# Patient Record
Sex: Female | Born: 1965 | Race: Black or African American | Hispanic: No | Marital: Single | Smoking: Never smoker
Health system: Southern US, Community
[De-identification: ages and names within clinical notes are randomized; demographics above are authoritative.]

## PROBLEM LIST (undated history)

## (undated) DIAGNOSIS — N289 Disorder of kidney and ureter, unspecified: Secondary | ICD-10-CM

## (undated) DIAGNOSIS — I82409 Acute embolism and thrombosis of unspecified deep veins of unspecified lower extremity: Secondary | ICD-10-CM

## (undated) DIAGNOSIS — I2699 Other pulmonary embolism without acute cor pulmonale: Secondary | ICD-10-CM

## (undated) DIAGNOSIS — K56609 Unspecified intestinal obstruction, unspecified as to partial versus complete obstruction: Secondary | ICD-10-CM

## (undated) DIAGNOSIS — I10 Essential (primary) hypertension: Secondary | ICD-10-CM

## (undated) DIAGNOSIS — Z992 Dependence on renal dialysis: Secondary | ICD-10-CM

## (undated) DIAGNOSIS — E119 Type 2 diabetes mellitus without complications: Secondary | ICD-10-CM

## (undated) HISTORY — PX: COLON RESECTION: SHX5231

## (undated) HISTORY — PX: GASTROPLASTY DUODENAL SWITCH: SHX1699

---

## 2017-06-26 ENCOUNTER — Other Ambulatory Visit: Payer: Self-pay

## 2017-06-26 ENCOUNTER — Encounter (HOSPITAL_BASED_OUTPATIENT_CLINIC_OR_DEPARTMENT_OTHER): Payer: Self-pay

## 2017-06-26 ENCOUNTER — Emergency Department (HOSPITAL_BASED_OUTPATIENT_CLINIC_OR_DEPARTMENT_OTHER): Payer: Medicare Other

## 2017-06-26 ENCOUNTER — Emergency Department (HOSPITAL_BASED_OUTPATIENT_CLINIC_OR_DEPARTMENT_OTHER)
Admission: EM | Admit: 2017-06-26 | Discharge: 2017-06-27 | Disposition: A | Discharge status: Home / Self Care | Payer: Medicare Other | Attending: Emergency Medicine | Admitting: Emergency Medicine

## 2017-06-26 DIAGNOSIS — I10 Essential (primary) hypertension: Secondary | ICD-10-CM | POA: Insufficient documentation

## 2017-06-26 DIAGNOSIS — R110 Nausea: Secondary | ICD-10-CM | POA: Insufficient documentation

## 2017-06-26 DIAGNOSIS — R1032 Left lower quadrant pain: Secondary | ICD-10-CM | POA: Insufficient documentation

## 2017-06-26 DIAGNOSIS — K5900 Constipation, unspecified: Secondary | ICD-10-CM | POA: Diagnosis not present

## 2017-06-26 DIAGNOSIS — D735 Infarction of spleen: Secondary | ICD-10-CM | POA: Insufficient documentation

## 2017-06-26 DIAGNOSIS — R109 Unspecified abdominal pain: Secondary | ICD-10-CM | POA: Diagnosis present

## 2017-06-26 DIAGNOSIS — E119 Type 2 diabetes mellitus without complications: Secondary | ICD-10-CM | POA: Insufficient documentation

## 2017-06-26 HISTORY — DX: Unspecified intestinal obstruction, unspecified as to partial versus complete obstruction: K56.609

## 2017-06-26 HISTORY — DX: Other pulmonary embolism without acute cor pulmonale: I26.99

## 2017-06-26 HISTORY — DX: Essential (primary) hypertension: I10

## 2017-06-26 HISTORY — DX: Type 2 diabetes mellitus without complications: E11.9

## 2017-06-26 HISTORY — DX: Acute embolism and thrombosis of unspecified deep veins of unspecified lower extremity: I82.409

## 2017-06-26 HISTORY — DX: Dependence on renal dialysis: Z99.2

## 2017-06-26 LAB — COMPREHENSIVE METABOLIC PANEL
ALK PHOS: 109 U/L (ref 38–126)
ALT: 18 U/L (ref 0–44)
AST: 16 U/L (ref 15–41)
Albumin: 3.1 g/dL — ABNORMAL LOW (ref 3.5–5.0)
Anion gap: 11 (ref 5–15)
BILIRUBIN TOTAL: 0.5 mg/dL (ref 0.3–1.2)
BUN: 44 mg/dL — ABNORMAL HIGH (ref 6–20)
CALCIUM: 9.1 mg/dL (ref 8.9–10.3)
CO2: 29 mmol/L (ref 22–32)
CREATININE: 5.86 mg/dL — AB (ref 0.44–1.00)
Chloride: 95 mmol/L — ABNORMAL LOW (ref 98–111)
GFR calc Af Amer: 9 mL/min — ABNORMAL LOW (ref >60)
GFR, EST NON AFRICAN AMERICAN: 8 mL/min — AB (ref >60)
GLUCOSE: 160 mg/dL — AB (ref 70–99)
POTASSIUM: 3.7 mmol/L (ref 3.5–5.1)
Sodium: 135 mmol/L (ref 135–145)
TOTAL PROTEIN: 7 g/dL (ref 6.5–8.1)

## 2017-06-26 LAB — CBC WITH DIFFERENTIAL/PLATELET
BASOS ABS: 0 10*3/uL (ref 0.0–0.1)
Basophils Relative: 0 %
EOS PCT: 3 %
Eosinophils Absolute: 0.3 10*3/uL (ref 0.0–0.7)
HEMATOCRIT: 30.2 % — AB (ref 36.0–46.0)
HEMOGLOBIN: 9.6 g/dL — AB (ref 12.0–15.0)
LYMPHS ABS: 1.1 10*3/uL (ref 0.7–4.0)
LYMPHS PCT: 10 %
MCH: 29.8 pg (ref 26.0–34.0)
MCHC: 31.8 g/dL (ref 30.0–36.0)
MCV: 93.8 fL (ref 78.0–100.0)
Monocytes Absolute: 0.6 10*3/uL (ref 0.1–1.0)
Monocytes Relative: 6 %
NEUTROS ABS: 8.7 10*3/uL — AB (ref 1.7–7.7)
NEUTROS PCT: 81 %
PLATELETS: 127 10*3/uL — AB (ref 150–400)
RBC: 3.22 MIL/uL — AB (ref 3.87–5.11)
RDW: 15.2 % (ref 11.5–15.5)
WBC: 10.7 10*3/uL — ABNORMAL HIGH (ref 4.0–10.5)

## 2017-06-26 LAB — LIPASE, BLOOD: Lipase: 20 U/L (ref 11–51)

## 2017-06-26 MED ORDER — ONDANSETRON HCL 4 MG/2ML IJ SOLN
4.0000 mg | Freq: Once | INTRAMUSCULAR | Status: AC
  Administered 2017-06-27: 4 mg via INTRAVENOUS
  Filled 2017-06-26: qty 2

## 2017-06-26 MED ORDER — ONDANSETRON HCL 4 MG/2ML IJ SOLN
4.0000 mg | Freq: Once | INTRAMUSCULAR | Status: AC
  Administered 2017-06-26: 4 mg via INTRAVENOUS
  Filled 2017-06-26: qty 2

## 2017-06-26 MED ORDER — HYDROMORPHONE HCL 1 MG/ML IJ SOLN
0.5000 mg | Freq: Once | INTRAMUSCULAR | Status: AC
  Administered 2017-06-27: 0.5 mg via INTRAVENOUS
  Filled 2017-06-26: qty 1

## 2017-06-26 MED ORDER — IOPAMIDOL (ISOVUE-300) INJECTION 61%
100.0000 mL | Freq: Once | INTRAVENOUS | Status: AC | PRN
  Administered 2017-06-26: 100 mL via INTRAVENOUS

## 2017-06-26 MED ORDER — HYDROMORPHONE HCL 1 MG/ML IJ SOLN
0.5000 mg | Freq: Once | INTRAMUSCULAR | Status: AC
Start: 2017-06-26 — End: 2017-06-26
  Administered 2017-06-26: 0.5 mg via INTRAVENOUS
  Filled 2017-06-26: qty 1

## 2017-06-26 NOTE — ED Notes (Signed)
Patient transported to CT 

## 2017-06-26 NOTE — ED Provider Notes (Signed)
[facility] EMERGENCY DEPARTMENT Provider Note   CSN: [id number] Arrival date & time: 06/26/17  2100     History   Chief Complaint Chief Complaint  Patient presents with  . Abdominal Pain    HPI Katie Sharp is a 52 y.o. female.  52 yo F with a chief complaint of abdominal pain.  This is worse to the left flank.  Seems to come and go.  Now is more generalized.  Feels that severe.  Has a history of multiple small bowel obstructions in the past.  Does not think this quite feels the same.  Nausea but no vomiting.  Some constipation.  The history is provided by the patient.  Abdominal Pain   This is a recurrent problem. The current episode started yesterday. The problem occurs constantly. The problem has been rapidly worsening. The pain is located in the LLQ. The quality of the pain is sharp and shooting. The pain is at a severity of 9/10. The pain is severe. Associated symptoms include nausea and constipation. Pertinent negatives include fever, vomiting, dysuria, headaches, arthralgias and myalgias. Nothing aggravates the symptoms. Nothing relieves the symptoms.    Past Medical History:  Diagnosis Date  . Bowel obstruction (HCC)   . Diabetes mellitus without complication (HCC)   . Dialysis patient (HCC)   . DVT (deep venous thrombosis) (HCC)   . Hypertension   . Pulmonary emboli (HCC)     There are no active problems to display for this patient.   Past Surgical History:  Procedure Laterality Date  . COLON RESECTION    . GASTROPLASTY DUODENAL SWITCH       OB History   None      Home Medications    Prior to Admission medications   Medication Sig Start Date End Date Taking? Authorizing Provider  ondansetron (ZOFRAN ODT) 4 MG disintegrating tablet 4mg  ODT q4 hours prn nausea/vomit 06/26/17   Melene PlanFloyd, Linford Quintela, DO  oxyCODONE (ROXICODONE) 5 MG immediate release tablet Take 1 tablet (5 mg total) by mouth every 4 (four) hours as needed for severe pain. 06/26/17    Melene PlanFloyd, Eddy Termine, DO    Family History No family history on file.  Social History Social History   Tobacco Use  . Smoking status: Never Smoker  . Smokeless tobacco: Never Used  Substance Use Topics  . Alcohol use: Yes    Comment: occ  . Drug use: Never     Allergies   Lisinopril; Oxycodone; and Statins   Review of Systems Review of Systems  Constitutional: Negative for chills and fever.  HENT: Negative for congestion and rhinorrhea.   Eyes: Negative for redness and visual disturbance.  Respiratory: Negative for shortness of breath and wheezing.   Cardiovascular: Negative for chest pain and palpitations.  Gastrointestinal: Positive for abdominal pain, constipation and nausea. Negative for vomiting.  Genitourinary: Negative for dysuria and urgency.  Musculoskeletal: Negative for arthralgias and myalgias.  Skin: Negative for pallor and wound.  Neurological: Negative for dizziness and headaches.     Physical Exam Updated Vital Signs BP 112/66 (BP Location: Right Arm)   Pulse 92   Temp 98.4 F (36.9 C) (Oral)   Resp 20   Ht 5\' 5"  (1.651 m)   Wt 106.6 kg (235 lb)   SpO2 94%   BMI 39.11 kg/m   Physical Exam  Constitutional: She is oriented to person, place, and time. She appears well-developed and well-nourished. No distress.  HENT:  Head: Normocephalic and atraumatic.  Eyes: Pupils are  equal, round, and reactive to light. EOM are normal.  Neck: Normal range of motion. Neck supple.  Cardiovascular: Normal rate and regular rhythm. Exam reveals no gallop and no friction rub.  No murmur heard. Pulmonary/Chest: Effort normal. She has no wheezes. She has no rales.  Abdominal: Soft. She exhibits distension ( Tympanitic to percussion). There is generalized tenderness.  Musculoskeletal: She exhibits no edema or tenderness.  Left AV fistula with palpable thrill  Neurological: She is alert and oriented to person, place, and time.  Skin: Skin is warm and dry. She is not  diaphoretic.  Psychiatric: She has a normal mood and affect. Her behavior is normal.  Nursing note and vitals reviewed.    ED Treatments / Results  Labs (all labs ordered are listed, but only abnormal results are displayed) Labs Reviewed  CBC WITH DIFFERENTIAL/PLATELET - Abnormal; Notable for the following components:      Result Value   WBC 10.7 (*)    RBC 3.22 (*)    Hemoglobin 9.6 (*)    HCT 30.2 (*)    Platelets 127 (*)    Neutro Abs 8.7 (*)    All other components within normal limits  COMPREHENSIVE METABOLIC PANEL - Abnormal; Notable for the following components:   Chloride 95 (*)    Glucose, Bld 160 (*)    BUN 44 (*)    Creatinine, Ser 5.86 (*)    Albumin 3.1 (*)    GFR calc non Af Amer 8 (*)    GFR calc Af Amer 9 (*)    All other components within normal limits  LIPASE, BLOOD    EKG None  Radiology Ct Abdomen Pelvis W Contrast  Result Date: 06/26/2017 CLINICAL DATA:  52 y/o F; 2 days of left-sided abdominal pain. ESRD and constipation. EXAM: CT ABDOMEN AND PELVIS WITH CONTRAST TECHNIQUE: Multidetector CT imaging of the abdomen and pelvis was performed using the standard protocol following bolus administration of intravenous contrast. CONTRAST:  ISOVUE-300 IOPAMIDOL (ISOVUE-300) INJECTION 61% COMPARISON:  None. FINDINGS: Lower chest: Aortic valvular and mitral annular calcification. Hepatobiliary: No focal liver abnormality is seen. Status post cholecystectomy. No biliary dilatation. Pancreas: Unremarkable. No pancreatic ductal dilatation or surrounding inflammatory changes. Spleen: Wedge-shaped low-attenuation focus within the inferior spleen (series 6, image 98). Mild surrounding edema. Adrenals/Urinary Tract: Adrenal glands are unremarkable. Kidneys are normal, without renal calculi, focal lesion, or hydronephrosis. Bladder is unremarkable. Stomach/Bowel: Gastroplasty postsurgical changes with patent anastomosis. Appendix appears normal. No evidence of bowel wall  thickening, distention, or inflammatory changes. Vascular/Lymphatic: Aortic atherosclerosis. No enlarged abdominal or pelvic lymph nodes. Reproductive: Uterus and bilateral adnexa are unremarkable. Other: No abdominal wall hernia or abnormality. No abdominopelvic ascites. Musculoskeletal: No acute or significant osseous findings. 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 Electronically Signed   By: Mitzi Hansen M.D.   On: 06/26/2017 23:21    Procedures Procedures (including critical care time)  Medications Ordered in ED Medications  HYDROmorphone (DILAUDID) injection 0.5 mg (0.5 mg Intravenous Given 06/26/17 2159)  ondansetron (ZOFRAN) injection 4 mg (4 mg Intravenous Given 06/26/17 2159)  iopamidol (ISOVUE-300) 61 % injection 100 mL (100 mLs Intravenous Contrast Given 06/26/17 2250)  HYDROmorphone (DILAUDID) injection 0.5 mg (0.5 mg Intravenous Given 06/27/17 0003)  ondansetron (ZOFRAN) injection 4 mg (4 mg Intravenous Given 06/27/17 0002)     Initial Impression / Assessment and Plan / ED Course  I have reviewed the triage vital signs and the nursing notes.  Pertinent labs & imaging results that were available during my care of the patient were reviewed by me and considered in my medical decision making (see chart for details).     52 yo F with a chief complaint of left sided abdominal pain.  On my exam is diffusely tender with some distention and tympanitic to percussion.  With history of small bowel obstruction in the past will obtain a CT scan with contrast.  CT with a possible splenic infarct.  Patient feeling better on reassessment.  With her history of thromboembolic events I suspect this is the most likely cause.  She is having no infectious symptoms concerning for septic emboli.  Will give pain and nausea meds for home, she will call her doctor at [facility] and discuss the findings.  She is on eliquis for  prior thromboembolic events.  She will return for worsening pain, fever inability to eat or drink.   12:06 AM:  I have discussed the diagnosis/risks/treatment options with the patient and family and believe the pt to be eligible for discharge home to follow-up with PCP, transplant, renal. We also discussed returning to the ED immediately if new or worsening sx occur. We discussed the sx which are most concerning (e.g., sudden worsening pain, fever, inability to tolerate by mouth) that necessitate immediate return. Medications administered to the patient during their visit and any new prescriptions provided to the patient are listed below.  Medications given during this visit Medications  HYDROmorphone (DILAUDID) injection 0.5 mg (0.5 mg Intravenous Given 06/26/17 2159)  ondansetron (ZOFRAN) injection 4 mg (4 mg Intravenous Given 06/26/17 2159)  iopamidol (ISOVUE-300) 61 % injection 100 mL (100 mLs Intravenous Contrast Given 06/26/17 2250)  HYDROmorphone (DILAUDID) injection 0.5 mg (0.5 mg Intravenous Given 06/27/17 0003)  ondansetron (ZOFRAN) injection 4 mg (4 mg Intravenous Given 06/27/17 0002)     The patient appears reasonably screen and/or stabilized for discharge and I doubt any other medical condition or other [facility] requiring further screening, evaluation, or treatment in the ED at this time prior to discharge.    Final Clinical Impressions(s) / ED Diagnoses   Final diagnoses:  Splenic infarct    ED Discharge Orders        Ordered    ondansetron (ZOFRAN ODT) 4 MG disintegrating tablet     06/27/17 0000    oxyCODONE (ROXICODONE) 5 MG immediate release tablet  Every 4 hours PRN     06/27/17 0000       Melene Plan, DO 06/27/17 0006

## 2017-06-26 NOTE — ED Triage Notes (Signed)
C/o left side abd pain, constipation-NAD-steady gait

## 2017-06-26 NOTE — ED Notes (Signed)
ED Provider at bedside. 

## 2017-06-27 DIAGNOSIS — D735 Infarction of spleen: Secondary | ICD-10-CM | POA: Diagnosis not present

## 2017-06-27 MED ORDER — HYDROMORPHONE HCL 2 MG PO TABS: 2.0000 mg | ORAL_TABLET | ORAL | 0 refills | Status: AC | PRN

## 2017-06-27 MED ORDER — OXYCODONE HCL 5 MG PO TABS: 5.0000 mg | ORAL_TABLET | ORAL | 0 refills | Status: DC | PRN

## 2017-06-27 MED ORDER — PROMETHAZINE HCL 25 MG PO TABS: 25.0000 mg | ORAL_TABLET | Freq: Four times a day (QID) | ORAL | 0 refills | Status: AC | PRN

## 2017-06-27 MED ORDER — ONDANSETRON 4 MG PO TBDP: ORAL_TABLET | ORAL | 0 refills | Status: DC

## 2017-06-27 NOTE — ED Notes (Signed)
Topaz not working at this time. Pt given d/c papers and attempted to sign but unable to capture.

## 2017-06-27 NOTE — Discharge Instructions (Signed)
Take tylenol 1000mg(2 extra strength) four times a day.  ° °Then take the pain medicine if you feel like you need it. Narcotics do not help with the pain, they only make you care about it less.  You can become addicted to this, people may break into your house to steal it.  It will constipate you.  If you drive under the influence of this medicine you can get a DUI.   ° °

## 2017-07-15 ENCOUNTER — Observation Stay (HOSPITAL_BASED_OUTPATIENT_CLINIC_OR_DEPARTMENT_OTHER)
Admission: EM | Admit: 2017-07-15 | Discharge: 2017-07-16 | Disposition: A | Discharge status: Home / Self Care | Payer: Medicare Other | Attending: Internal Medicine | Admitting: Internal Medicine

## 2017-07-15 ENCOUNTER — Other Ambulatory Visit: Payer: Self-pay

## 2017-07-15 ENCOUNTER — Encounter (HOSPITAL_BASED_OUTPATIENT_CLINIC_OR_DEPARTMENT_OTHER): Payer: Self-pay | Admitting: Emergency Medicine

## 2017-07-15 DIAGNOSIS — Z9049 Acquired absence of other specified parts of digestive tract: Secondary | ICD-10-CM | POA: Insufficient documentation

## 2017-07-15 DIAGNOSIS — Z86711 Personal history of pulmonary embolism: Secondary | ICD-10-CM | POA: Diagnosis not present

## 2017-07-15 DIAGNOSIS — F419 Anxiety disorder, unspecified: Secondary | ICD-10-CM | POA: Diagnosis not present

## 2017-07-15 DIAGNOSIS — E1122 Type 2 diabetes mellitus with diabetic chronic kidney disease: Secondary | ICD-10-CM | POA: Diagnosis not present

## 2017-07-15 DIAGNOSIS — Z888 Allergy status to other drugs, medicaments and biological substances status: Secondary | ICD-10-CM | POA: Insufficient documentation

## 2017-07-15 DIAGNOSIS — Z79899 Other long term (current) drug therapy: Secondary | ICD-10-CM | POA: Insufficient documentation

## 2017-07-15 DIAGNOSIS — N186 End stage renal disease: Secondary | ICD-10-CM | POA: Diagnosis not present

## 2017-07-15 DIAGNOSIS — F410 Panic disorder [episodic paroxysmal anxiety] without agoraphobia: Secondary | ICD-10-CM | POA: Diagnosis not present

## 2017-07-15 DIAGNOSIS — I120 Hypertensive chronic kidney disease with stage 5 chronic kidney disease or end stage renal disease: Secondary | ICD-10-CM | POA: Insufficient documentation

## 2017-07-15 DIAGNOSIS — D62 Acute posthemorrhagic anemia: Secondary | ICD-10-CM | POA: Diagnosis not present

## 2017-07-15 DIAGNOSIS — I959 Hypotension, unspecified: Secondary | ICD-10-CM | POA: Diagnosis not present

## 2017-07-15 DIAGNOSIS — Z7901 Long term (current) use of anticoagulants: Secondary | ICD-10-CM | POA: Insufficient documentation

## 2017-07-15 DIAGNOSIS — D649 Anemia, unspecified: Secondary | ICD-10-CM | POA: Diagnosis present

## 2017-07-15 DIAGNOSIS — Z885 Allergy status to narcotic agent status: Secondary | ICD-10-CM | POA: Diagnosis not present

## 2017-07-15 DIAGNOSIS — K219 Gastro-esophageal reflux disease without esophagitis: Secondary | ICD-10-CM | POA: Insufficient documentation

## 2017-07-15 DIAGNOSIS — E1151 Type 2 diabetes mellitus with diabetic peripheral angiopathy without gangrene: Secondary | ICD-10-CM | POA: Insufficient documentation

## 2017-07-15 DIAGNOSIS — E876 Hypokalemia: Secondary | ICD-10-CM | POA: Diagnosis not present

## 2017-07-15 DIAGNOSIS — Z992 Dependence on renal dialysis: Secondary | ICD-10-CM

## 2017-07-15 HISTORY — DX: Disorder of kidney and ureter, unspecified: N28.9

## 2017-07-15 LAB — CBC WITH DIFFERENTIAL/PLATELET
BASOS ABS: 0 10*3/uL (ref 0.0–0.1)
Basophils Relative: 0 %
EOS PCT: 3 %
Eosinophils Absolute: 0.2 10*3/uL (ref 0.0–0.7)
HEMATOCRIT: 18.6 % — AB (ref 36.0–46.0)
HEMOGLOBIN: 5.7 g/dL — AB (ref 12.0–15.0)
LYMPHS ABS: 1.1 10*3/uL (ref 0.7–4.0)
LYMPHS PCT: 15 %
MCH: 29.8 pg (ref 26.0–34.0)
MCHC: 30.6 g/dL (ref 30.0–36.0)
MCV: 97.4 fL (ref 78.0–100.0)
Monocytes Absolute: 0.4 10*3/uL (ref 0.1–1.0)
Monocytes Relative: 5 %
NEUTROS PCT: 77 %
Neutro Abs: 5.5 10*3/uL (ref 1.7–7.7)
PLATELETS: 118 10*3/uL — AB (ref 150–400)
RBC: 1.91 MIL/uL — ABNORMAL LOW (ref 3.87–5.11)
RDW: 14.7 % (ref 11.5–15.5)
WBC: 7.2 10*3/uL (ref 4.0–10.5)

## 2017-07-15 LAB — BASIC METABOLIC PANEL
ANION GAP: 12 (ref 5–15)
BUN: 31 mg/dL — AB (ref 6–20)
CHLORIDE: 95 mmol/L — AB (ref 98–111)
CO2: 28 mmol/L (ref 22–32)
Calcium: 8.5 mg/dL — ABNORMAL LOW (ref 8.9–10.3)
Creatinine, Ser: 4.81 mg/dL — ABNORMAL HIGH (ref 0.44–1.00)
GFR, EST AFRICAN AMERICAN: 11 mL/min — AB (ref >60)
GFR, EST NON AFRICAN AMERICAN: 10 mL/min — AB (ref >60)
Glucose, Bld: 183 mg/dL — ABNORMAL HIGH (ref 70–99)
Potassium: 2.8 mmol/L — ABNORMAL LOW (ref 3.5–5.1)
SODIUM: 135 mmol/L (ref 135–145)

## 2017-07-15 LAB — MRSA PCR SCREENING: MRSA by PCR: NEGATIVE

## 2017-07-15 LAB — ABO/RH: ABO/RH(D): O POS

## 2017-07-15 LAB — PREPARE RBC (CROSSMATCH)

## 2017-07-15 MED ORDER — HYDROMORPHONE HCL 2 MG PO TABS
2.0000 mg | ORAL_TABLET | ORAL | Status: DC | PRN
  Administered 2017-07-15 – 2017-07-16 (×4): 2 mg via ORAL
  Filled 2017-07-15 (×4): qty 1

## 2017-07-15 MED ORDER — APIXABAN 5 MG PO TABS
5.0000 mg | ORAL_TABLET | Freq: Two times a day (BID) | ORAL | Status: DC
  Administered 2017-07-15 – 2017-07-16 (×3): 5 mg via ORAL
  Filled 2017-07-15 (×3): qty 1

## 2017-07-15 MED ORDER — DILTIAZEM HCL ER COATED BEADS 120 MG PO CP24
120.0000 mg | ORAL_CAPSULE | Freq: Every day | ORAL | Status: DC
  Administered 2017-07-15: 120 mg via ORAL
  Filled 2017-07-15: qty 1

## 2017-07-15 MED ORDER — CALCITRIOL 0.5 MCG PO CAPS
1.5000 ug | ORAL_CAPSULE | Freq: Every day | ORAL | Status: DC
  Administered 2017-07-15: 1.5 ug via ORAL
  Filled 2017-07-15 (×2): qty 6
  Filled 2017-07-15 (×2): qty 3

## 2017-07-15 MED ORDER — AMITRIPTYLINE HCL 25 MG PO TABS
25.0000 mg | ORAL_TABLET | Freq: Every day | ORAL | Status: DC
  Administered 2017-07-15: 25 mg via ORAL
  Filled 2017-07-15: qty 1

## 2017-07-15 MED ORDER — SENNA 8.6 MG PO TABS: 1.0000 | ORAL_TABLET | Freq: Every day | ORAL | Status: DC | PRN

## 2017-07-15 MED ORDER — POLYETHYLENE GLYCOL 3350 17 G PO PACK
17.0000 g | PACK | Freq: Every day | ORAL | Status: DC
  Administered 2017-07-16: 17 g via ORAL
  Filled 2017-07-15: qty 1

## 2017-07-15 MED ORDER — BUPROPION HCL ER (XL) 150 MG PO TB24
150.0000 mg | ORAL_TABLET | Freq: Every day | ORAL | Status: DC
  Administered 2017-07-15 – 2017-07-16 (×2): 150 mg via ORAL
  Filled 2017-07-15 (×2): qty 1

## 2017-07-15 MED ORDER — PANTOPRAZOLE SODIUM 40 MG PO TBEC
40.0000 mg | DELAYED_RELEASE_TABLET | Freq: Every day | ORAL | Status: DC
  Administered 2017-07-15 – 2017-07-16 (×2): 40 mg via ORAL
  Filled 2017-07-15 (×2): qty 1

## 2017-07-15 MED ORDER — CLONIDINE HCL 0.1 MG PO TABS
0.1000 mg | ORAL_TABLET | Freq: Every day | ORAL | Status: DC
  Filled 2017-07-15: qty 1

## 2017-07-15 MED ORDER — GABAPENTIN 300 MG PO CAPS
300.0000 mg | ORAL_CAPSULE | ORAL | Status: DC
  Administered 2017-07-15: 300 mg via ORAL
  Filled 2017-07-15: qty 1

## 2017-07-15 MED ORDER — POTASSIUM CHLORIDE CRYS ER 20 MEQ PO TBCR
40.0000 meq | EXTENDED_RELEASE_TABLET | ORAL | Status: AC
  Administered 2017-07-15: 40 meq via ORAL
  Filled 2017-07-15: qty 2

## 2017-07-15 MED ORDER — ONDANSETRON HCL 4 MG PO TABS: 4.0000 mg | ORAL_TABLET | Freq: Three times a day (TID) | ORAL | Status: DC | PRN

## 2017-07-15 MED ORDER — SODIUM CHLORIDE 0.9 % IV SOLN
10.0000 mL/h | Freq: Once | INTRAVENOUS | Status: DC
Start: 2017-07-15 — End: 2017-07-16

## 2017-07-15 MED ORDER — ACETAMINOPHEN 650 MG RE SUPP: 650.0000 mg | Freq: Four times a day (QID) | RECTAL | Status: DC | PRN

## 2017-07-15 MED ORDER — LIDOCAINE 4 % EX CREA
1.0000 "application " | TOPICAL_CREAM | CUTANEOUS | Status: DC | PRN
  Filled 2017-07-15: qty 5

## 2017-07-15 MED ORDER — ONDANSETRON HCL 4 MG PO TABS: 4.0000 mg | ORAL_TABLET | Freq: Four times a day (QID) | ORAL | Status: DC | PRN

## 2017-07-15 MED ORDER — PROMETHAZINE HCL 25 MG PO TABS
25.0000 mg | ORAL_TABLET | Freq: Four times a day (QID) | ORAL | Status: DC | PRN
Start: 2017-07-15 — End: 2017-07-16
  Administered 2017-07-15: 25 mg via ORAL
  Filled 2017-07-15: qty 1

## 2017-07-15 MED ORDER — ACETAMINOPHEN 325 MG PO TABS: 650.0000 mg | ORAL_TABLET | Freq: Four times a day (QID) | ORAL | Status: DC | PRN

## 2017-07-15 MED ORDER — ONDANSETRON HCL 4 MG/2ML IJ SOLN: 4.0000 mg | Freq: Four times a day (QID) | INTRAMUSCULAR | Status: DC | PRN

## 2017-07-15 NOTE — ED Notes (Signed)
[facility] reach [facility] [phone]; spoke to WoodsfieldBecky, Licensed conveyancerunit secretary.

## 2017-07-15 NOTE — ED Notes (Signed)
Pt on cardiac monitor and auto VS 

## 2017-07-15 NOTE — ED Notes (Signed)
[facility]

## 2017-07-15 NOTE — ED Notes (Signed)
ED Provider at bedside. 

## 2017-07-15 NOTE — ED Provider Notes (Addendum)
[facility] to hospitalist and blood transfusion. Please see previous note for full HP. Briefly, pt is a 52 yo F who presented to ER for bleeding from dialysis access in LUE.  Baseline anemia hgb 7.3. On eloquis. Has symptoms of anemia.   On my exam bleeding controlled, dry and intact dressing to LUE. Distal pulses intact. NAD. No CP or SOB at rest. No hypotension or tachycardia on [facility].   1235: Spoke to Dr Katrinka BlazingSmith who accepts patient. 2 units emergent blood ordered.    Liberty HandyGibbons, Latoy Labriola J, PA-C 07/15/17 1235    Lorre NickAllen, Anthony, MD 07/16/17 [phone]

## 2017-07-15 NOTE — ED Provider Notes (Signed)
[place] EMERGENCY DEPARTMENT Provider Note   CSN: [id number] Arrival date & time: 07/15/17  0906     History   Chief Complaint Chief Complaint  Patient presents with  . Hypotension    HPI Katie Sharp is a 52 y.o. female.  The history is provided by the patient. No language interpreter was used.   Katie Sharp is a 52 y.o. female who presents to the Emergency Department complaining of bleeding. She has a history of end-stage renal disease and performs home dialysis five days a week. Today was the first session of her five day stretch. She was two hours into her session when she felt warm fluid on her left arm and noticed that her dialysis needle began to come out (at 0750). She was able to return all her blood but did note about 15 minutes of significant bleeding. She does give herself 3200 units of heparin with dialysis. She had about one hour remaining on her dialysis session. She does have a baseline anemia and her hemoglobin was 7.3 a week ago. She is on eloquence twice daily for history of PE. She states that during and just after the bleeding episode she had significant dizziness with lightheadedness and shortness of breath. She does up tribute the shortness of breath due to panic attack. She also felt like she had poor hearing and felt like she was going to pass out. EMS was called and addressing was applied to the arm. Dressing has been hemostatic for EMS. She did have blood pressure of 80 systolic for EMS. She states that overall her dizziness is improving but she does still feel dizzy when she sits up. Her medical care is through [facility]. She does have a history of required transfusion previously but it is been greater than one year since last transfusion. She is being evaluated for renal transplant.  Her dry weight is 109.5 and that was her weight prior to initiating dialysis this morning.   Past Medical History:  Diagnosis Date  . Bowel obstruction ([place])   .  Diabetes mellitus without complication ([place])   . Dialysis patient ([place])   . DVT (deep venous thrombosis) ([place])   . Hypertension   . Pulmonary emboli ([place])   . Renal disorder     Patient Active Problem List   Diagnosis Date Noted  . Acute blood loss anemia 07/15/2017  . Symptomatic anemia 07/15/2017    Past Surgical History:  Procedure Laterality Date  . COLON RESECTION    . GASTROPLASTY DUODENAL SWITCH       OB History   None      Home Medications    Prior to Admission medications   Medication Sig Start Date End Date Taking? Authorizing Provider  acetaminophen (TYLENOL) 500 MG tablet Take 1,000 mg by mouth as needed for mild pain or headache.   Yes [provider]  amitriptyline (ELAVIL) 25 MG tablet Take 25 mg by mouth at bedtime.  01/30/17 01/30/18 Yes [provider]  apixaban (ELIQUIS) 5 MG TABS tablet Take 5 mg by mouth 2 (two) times daily.  10/03/16  Yes [provider]  B Complex-C-Zn-Folic Acid (DIALYVITE/ZINC) TABS Take by mouth. 12/08/15  Yes [provider]  buPROPion (WELLBUTRIN XL) 150 MG 24 hr tablet Take 150 mg by mouth daily.  01/30/17 01/30/18 Yes [provider]  calcitRIOL (ROCALTROL) 0.5 MCG capsule Take 3 Capsules (1.58mg) daily 06/11/17  Yes [provider]  Calcium Citrate-Vitamin D 315-250 MG-UNIT TABS Take by mouth.  09/27/16 09/27/17 Yes [provider]  diltiazem (CARDIZEM CD) 120 MG 24 hr capsule Take 120 mg by mouth daily.  05/16/17  Yes [provider]  epoetin alfa (EPOGEN,PROCRIT) 09643 UNIT/ML injection Inject 20,000 Units into the vein 3 (three) times a week.   Yes [provider]  gabapentin (NEURONTIN) 300 MG capsule TK ONE C PO NIGHTLY AND 1 C AFTER DIALYSIS 06/20/17  Yes [provider]  HEPARIN LOCK FLUSH IV Inject 32,000 Units into the vein. Using 5 times weekly   Yes [provider]  HYDROmorphone (DILAUDID) 2 MG tablet Take 1 tablet (2 mg total) by  mouth every 4 (four) hours as needed for severe pain. 06/27/17  Yes Deno Etienne, DO  Iron Sucrose (VENOFER IV) Inject 200 mg into the vein once a week.   Yes [provider]  lidocaine (LMX) 4 % cream Apply 1 application topically as needed (for toe pain).   Yes [provider]  ondansetron (ZOFRAN) 4 MG tablet Take 4 mg by mouth every 8 (eight) hours as needed for nausea.    Yes [provider]  pantoprazole (PROTONIX) 40 MG tablet TK 1 T PO ONCE D 05/16/17  Yes [provider]  polyethylene glycol (MIRALAX / GLYCOLAX) packet Take 17 g by mouth daily.   Yes [provider]  promethazine (PHENERGAN) 25 MG tablet Take 1 tablet (25 mg total) by mouth every 6 (six) hours as needed for nausea or vomiting. 06/27/17  Yes Deno Etienne, DO  senna (SENOKOT) 8.6 MG TABS tablet Take 1 tablet by mouth daily as needed for mild constipation.  05/08/17  Yes [provider]  cloNIDine (CATAPRES) 0.1 MG tablet TK 1 T PO BID 05/08/17   [provider]    Family History No family history on file.  Social History Social History   Tobacco Use  . Smoking status: Never Smoker  . Smokeless tobacco: Never Used  Substance Use Topics  . Alcohol use: Yes    Comment: occ  . Drug use: Never     Allergies   Lisinopril; Statins; and Oxycodone   Review of Systems Review of Systems  All other systems reviewed and are negative.    Physical Exam Updated Vital Signs BP 130/72   Pulse 94   Temp 99 F (37.2 C) (Oral)   Resp 19   Ht '5\' 5"'  (1.651 m)   Wt 109.5 kg (241 lb 6.5 oz)   SpO2 98%   BMI 40.17 kg/m   Physical Exam  Constitutional: She is oriented to person, place, and time. She appears well-developed and well-nourished.  HENT:  Head: Normocephalic and atraumatic.  Cardiovascular: Normal rate and regular rhythm.  Murmur heard. Systolic ejection murmur  Pulmonary/Chest: Effort normal and breath sounds normal. No respiratory distress.    Abdominal: Soft. There is no tenderness. There is no rebound and no guarding.  Musculoskeletal: She exhibits no edema or tenderness.  Fistula in LUE with dressing in place, C/D/I.    Neurological: She is alert and oriented to person, place, and time.  Skin: Skin is warm and dry.  Psychiatric: She has a normal mood and affect. Her behavior is normal.  Nursing note and vitals reviewed.    ED Treatments / Results  Labs (all labs ordered are listed, but only abnormal results are displayed) Labs Reviewed  BASIC METABOLIC PANEL - Abnormal; Notable for the following components:      Result Value   Potassium 2.8 (*)  Chloride 95 (*)    Glucose, Bld 183 (*)    BUN 31 (*)    Creatinine, Ser 4.81 (*)    Calcium 8.5 (*)    GFR calc non Af Amer 10 (*)    GFR calc Af Amer 11 (*)    All other components within normal limits  CBC WITH DIFFERENTIAL/PLATELET - Abnormal; Notable for the following components:   RBC 1.91 (*)    Hemoglobin 5.7 (*)    HCT 18.6 (*)    Platelets 118 (*)    All other components within normal limits  MRSA PCR SCREENING  TYPE AND SCREEN  PREPARE RBC (CROSSMATCH)  ABO/RH    EKG EKG Interpretation  Date/Time:  Sunday July 15 2017 09:20:05 EDT Ventricular Rate:  89 PR Interval:    QRS Duration: 85 QT Interval:  339 QTC Calculation: 413 R Axis:   -5 Text Interpretation:  Sinus rhythm Baseline wander in lead(s) V1 Confirmed by Quintella Reichert [id number]) on 07/15/2017 9:30:24 AM   Radiology No results found.  Procedures Procedures (including critical care time) CRITICAL CARE Performed by: Quintella Reichert   Total critical care time: 35 minutes  Critical care time was exclusive of separately billable procedures and treating other patients.  Critical care was necessary to treat or prevent imminent or life-threatening deterioration.  Critical care was time spent personally by me on the following activities: development of treatment plan with patient and/or  surrogate as well as nursing, discussions with consultants, evaluation of patient's response to treatment, examination of patient, obtaining history from patient or surrogate, ordering and performing treatments and interventions, ordering and review of laboratory studies, ordering and review of radiographic studies, pulse oximetry and re-evaluation of patient's condition.  Medications Ordered in ED Medications  0.9 %  sodium chloride infusion (has no administration in time range)  amitriptyline (ELAVIL) tablet 25 mg (has no administration in time range)  apixaban (ELIQUIS) tablet 5 mg (5 mg Oral Given 07/15/17 1618)  buPROPion (WELLBUTRIN XL) 24 hr tablet 150 mg (150 mg Oral Given 07/15/17 1613)  promethazine (PHENERGAN) tablet 25 mg (has no administration in time range)  senna (SENOKOT) tablet 8.6 mg (has no administration in time range)  pantoprazole (PROTONIX) EC tablet 40 mg (40 mg Oral Given 07/15/17 1613)  HYDROmorphone (DILAUDID) tablet 2 mg (2 mg Oral Given 07/15/17 1322)  lidocaine (LMX) 4 % cream 1 application (has no administration in time range)  polyethylene glycol (MIRALAX / GLYCOLAX) packet 17 g (has no administration in time range)  diltiazem (CARDIZEM CD) 24 hr capsule 120 mg (has no administration in time range)  ondansetron (ZOFRAN) tablet 4 mg (has no administration in time range)    Or  ondansetron (ZOFRAN) injection 4 mg (has no administration in time range)  acetaminophen (TYLENOL) tablet 650 mg (has no administration in time range)    Or  acetaminophen (TYLENOL) suppository 650 mg (has no administration in time range)  calcitRIOL (ROCALTROL) capsule 1.5 mcg (1.5 mcg Oral Given 07/15/17 1613)  cloNIDine (CATAPRES) tablet 0.1 mg (has no administration in time range)  gabapentin (NEURONTIN) capsule 300 mg (has no administration in time range)  potassium chloride SA (K-DUR,KLOR-CON) CR tablet 40 mEq (40 mEq Oral Given 07/15/17 1254)     Initial Impression / Assessment and  Plan / ED Course  I have reviewed the triage vital signs and the nursing notes.  Pertinent labs & imaging results that were available during my care of the patient were reviewed by me and considered  in my medical decision making (see chart for details).  Clinical Course as of Jul 16 1623  Nancy Fetter Jul 15, 2017  1202 Potassium(!): 2.8 [CG]  1202 Creatinine(!): 4.81 [CG]  1202 Hemoglobin(!!): 5.7 [CG]    Clinical Course User Index [CG] Kinnie Feil, PA-C    Patient with ESR D on hemodialysis here for evaluation after hemorrhage from her dialysis access site. Bleeding is controlled in the emergency department. She did have hypotension prior to ED arrival. She is symptomatic with sitting up, feels improved with laying supine. She will need a blood transfusion for symptomatic anemia. Only uncrossmatched blood available at this facility. Plan to transfer emergently to [facility] for type and cross and blood transfusion, possible dialysis. Discussed with Dr. Tomi Bamberger who accepts the patient for ED to ED transfer. Hospitalist consulted for admission.  Patient does prefer transfer to [facility] as this is where her nephrologist is, but feels she is too unstable for transfer that far at this time. Discussed recommendation for [facility] and she is in agreement with plan.  Final Clinical Impressions(s) / ED Diagnoses   Final diagnoses:  Symptomatic anemia    ED Discharge Orders    None       Quintella Reichert, MD 07/15/17 1626

## 2017-07-15 NOTE — Progress Notes (Signed)
Per MD it is safe to administer Eliquis to patient today. Will continue to monitor.

## 2017-07-15 NOTE — ED Notes (Signed)
Date and time results received: 07/15/17 1007   Test: hgb Critical Value:5.7 Name of Provider Notified: Madilyn Hookees  Orders Received? Or Actions Taken?: no orders given

## 2017-07-15 NOTE — H&P (Signed)
History and Physical    Katie DolinLoretta Borin VHQ:[id number] DOB: 1965/02/15 DOA: 07/15/2017  Referring MD/NP/PA: Sharen Hecklaudia Gibbons, PA-C  PCP: Ival BibleYoung, Kelly M, PA-C  [facility]  Chief Complaint: bleeding   I have personally briefly reviewed patient's [facility] medical [facility]   HPI: Katie Sharp is a 52 y.o. female with medical history significant of HTN, DM type 2, ESRD on home HD awaiting transplant from [facility], PE on Eliquis, PVD, and anxiety; who presents with complaints of bleeding.  Patient normally performs home hemodialysis 5 days a week skipping Wednesday and Saturday. She was approximately 2 hours into her session this morning when she felt blood running down her arm and noticed that the arterial dialysis needle came out.  She was able to return all of her blood thereafter, but was unable to stop bleeding for approximately 10 to 15 minutes.  Patient reported feeling lightheadedness, anxiety, and nausea.  Other associated symptoms include second and third right toe pain that has been ongoing over the last 3 weeks.  She is being worked up by vascular surgery because of decreased blood flow in the right leg.  Denies any chest pain, lightheadedness, vomiting, or shortness of breath. She had given herself 3200 units of heparin prior to dialysis, but not had not taken her morning Eliquis.  Patient reports a dry weight of 109.5 kg, but last office notes report a dry weight of his low as 105.5 kg. Patient's blood work from last week report hemoglobin at 7.3, and baseline runs around 8. Due to her inability to stop the bleeding she called EMS. En route with EMS she was given 200 mL of normal saline because she was noted to be hypotensive.    ED Course: On admission into the emergency department patient was noted to be afebrile, respirations 13-24, blood pressure 86/57-111/48, and all other vitals maintained.  Labs revealed hemoglobin 5.7 and potassium 2.8.  [facility].  Bleeding was controlled prior to transport.  Patient was typed and screened for blood products in ordered to be transfused 2 units of blood.  TRH called to admit.  Review of Systems  Constitutional: Positive for malaise/fatigue. Negative for fever.  HENT: Negative for ear discharge and nosebleeds.   Eyes: Negative for double vision and photophobia.  Respiratory: Negative for cough and shortness of breath.   Cardiovascular: Positive for claudication. Negative for chest pain and leg swelling.  Gastrointestinal: Positive for nausea. Negative for abdominal pain, blood in stool and vomiting.  Genitourinary: Negative for dysuria and hematuria.  Musculoskeletal: Positive for myalgias. Negative for falls.  Skin: Negative for itching.  Neurological: Positive for dizziness. Negative for loss of consciousness.  Endo/Heme/Allergies: Bruises/bleeds easily.  Psychiatric/Behavioral: Negative for suicidal ideas. The patient is nervous/anxious.     Past Medical History:  Diagnosis Date  . Bowel obstruction (HCC)   . Diabetes mellitus without complication (HCC)   . Dialysis patient (HCC)   . DVT (deep venous thrombosis) (HCC)   . Hypertension   . Pulmonary emboli (HCC)   . Renal disorder     Past Surgical History:  Procedure Laterality Date  . COLON RESECTION    . GASTROPLASTY DUODENAL SWITCH       reports that she has never smoked. She has never used smokeless tobacco. She reports that she drinks alcohol. She reports that she does not use drugs.  Allergies  Allergen Reactions  .  Lisinopril Cough  . Statins Other (See Comments)    Muscle cramps  . Oxycodone Itching and Rash    No family history on file.  Prior to Admission medications   Medication Sig Start Date End Date Taking? Authorizing Provider  acetaminophen (TYLENOL) 500 MG tablet Take 1,000 mg by mouth as needed for mild pain or headache.   Yes [provider]  amitriptyline (ELAVIL) 25 MG tablet Take 25 mg by mouth at bedtime.  01/30/17 01/30/18 Yes [provider]  apixaban (ELIQUIS) 5 MG TABS tablet Take 5 mg by mouth 2 (two) times daily.  10/03/16  Yes [provider]  B Complex-C-Zn-Folic Acid (DIALYVITE/ZINC) TABS Take by mouth. 12/08/15  Yes [provider]  buPROPion (WELLBUTRIN XL) 150 MG 24 hr tablet Take 150 mg by mouth daily.  01/30/17 01/30/18 Yes [provider]  calcitRIOL (ROCALTROL) 0.5 MCG capsule Take 3 Capsules (1.11mcg) daily 06/11/17  Yes [provider]  Calcium Citrate-Vitamin D 315-250 MG-UNIT TABS Take by mouth. 09/27/16 09/27/17 Yes [provider]  diltiazem (CARDIZEM CD) 120 MG 24 hr capsule Take 120 mg by mouth daily.  05/16/17  Yes [provider]  epoetin alfa (EPOGEN,PROCRIT) [address] UNIT/ML injection Inject 20,000 Units into the vein 3 (three) times a week.   Yes [provider]  gabapentin (NEURONTIN) 300 MG capsule TK ONE C PO NIGHTLY AND 1 C AFTER DIALYSIS 06/20/17  Yes [provider]  HEPARIN LOCK FLUSH IV Inject 32,000 Units into the vein. Using 5 times weekly   Yes [provider]  HYDROmorphone (DILAUDID) 2 MG tablet Take 1 tablet (2 mg total) by mouth every 4 (four) hours as needed for severe pain. 06/27/17  Yes Melene Plan, DO  Iron Sucrose (VENOFER IV) Inject 200 mg into the vein once a week.   Yes [provider]  lidocaine (LMX) 4 % cream Apply 1 application topically as needed (for toe pain).   Yes [provider]  ondansetron (ZOFRAN) 4 MG tablet Take 4 mg by mouth every 8 (eight) hours as needed for nausea.    Yes [provider]  pantoprazole (PROTONIX) 40 MG tablet TK 1 T PO ONCE D 05/16/17  Yes [provider]  polyethylene glycol (MIRALAX / GLYCOLAX) packet Take 17 g by mouth daily.   Yes [provider]  promethazine (PHENERGAN) 25 MG tablet Take 1 tablet (25 mg total)  by mouth every 6 (six) hours as needed for nausea or vomiting. 06/27/17  Yes Melene Plan, DO  senna (SENOKOT) 8.6 MG TABS tablet Take 1 tablet by mouth daily as needed for mild constipation.  05/08/17  Yes [provider]  cloNIDine (CATAPRES) 0.1 MG tablet TK 1 T PO BID 05/08/17   [provider]    Physical Exam:  Constitutional: Obese female in NAD, calm, comfortable Vitals:   07/15/17 0912 07/15/17 0933 07/15/17 1008 07/15/17 1030  BP: (!) 103/55 (!) 98/46 (!) 86/57 (!) 93/41  Pulse: 88  90 89  Resp: 18  19 16   Temp:      TempSrc:      SpO2: 98%  100% 100%  Weight:      Height:       Eyes: PERRL, lids and conjunctivae normal ENMT: Mucous membranes are moist. Posterior pharynx clear of any exudate or lesions.  Neck: normal, supple, no masses, no thyromegaly.  No JVD. Respiratory: clear to auscultation bilaterally, no wheezing, no crackles. Normal respiratory effort. No accessory muscle use.  Cardiovascular: Regular rate and rhythm.  Positive 3 out of 6 systolic ejection murmur.  No extremity edema. 1+ pedal pulses. No carotid bruits.  Fistula of the left upper arm.   Abdomen: no tenderness, no masses palpated. No hepatosplenomegaly. Bowel sounds positive.  Musculoskeletal: no clubbing / cyanosis. No joint deformity upper and lower extremities. Good ROM, no contractures. Normal muscle tone.  Skin: Mild purplish hue to the 2nd and 3rd digit of the right foot.  Neurologic: CN 2-12 grossly intact. Sensation intact, DTR normal. Strength 5/5 in all 4.  Psychiatric: Normal judgment and insight. Alert and oriented x 3.  Anxious mood.     Labs on Admission: I have personally reviewed following labs and imaging studies  CBC: Recent Labs  Lab 07/15/17 0939  WBC 7.2  NEUTROABS 5.5  HGB 5.7*  HCT 18.6*  MCV 97.4  PLT 118*   Basic Metabolic Panel: Recent Labs  Lab 07/15/17 0939  NA 135  K 2.8*  CL 95*  CO2 28  GLUCOSE 183*  BUN 31*  CREATININE 4.81*  CALCIUM  8.5*   GFR: Estimated Creatinine Clearance: 17.2 mL/min (A) (by C-G formula based on SCr of 4.81 mg/dL (H)). Liver Function Tests: No results for input(s): AST, ALT, ALKPHOS, BILITOT, PROT, ALBUMIN in the last 168 hours. No results for input(s): LIPASE, AMYLASE in the last 168 hours. No results for input(s): AMMONIA in the last 168 hours. Coagulation Profile: No results for input(s): INR, PROTIME in the last 168 hours. Cardiac Enzymes: No results for input(s): CKTOTAL, CKMB, CKMBINDEX, TROPONINI in the last 168 hours. BNP (last 3 results) No results for input(s): PROBNP in the last 8760 hours. HbA1C: No results for input(s): HGBA1C in the last 72 hours. CBG: No results for input(s): GLUCAP in the last 168 hours. Lipid Profile: No results for input(s): CHOL, HDL, LDLCALC, TRIG, CHOLHDL, LDLDIRECT in the last 72 hours. Thyroid Function Tests: No results for input(s): TSH, T4TOTAL, FREET4, T3FREE, THYROIDAB in the last 72 hours. Anemia Panel: No results for input(s): VITAMINB12, FOLATE, FERRITIN, TIBC, IRON, RETICCTPCT in the last 72 hours. Urine analysis: No results found for: COLORURINE, APPEARANCEUR, LABSPEC, PHURINE, GLUCOSEU, HGBUR, BILIRUBINUR, KETONESUR, PROTEINUR, UROBILINOGEN, NITRITE, LEUKOCYTESUR Sepsis Labs: No results found for this or any previous visit (from the past 240 hour(s)).   Radiological Exams on Admission: No results found.  EKG: Independently reviewe normal sinus rhythm 89 bpm with prolonged PR interval  Assessment/Plan Acute blood loss anemia: At baseline patient has chronic anemia with hemoglobin normally around 8 related to ESRD on Epogen injections.  However, after arterial access for home dialysis came out she had had significant bleeding with initial hemoglobin noted to be 5.7. Bleeding was able to be controlled.  Patient was typed and screened for need of blood products in order to be transfused 2 units of packed red blood cells. - Admit to a telemetry  bed   - Continue with transfusion of 2 units of packed red blood cells - Recheck hemoglobin after transfusion and transfuse blood products as needed  Hypotension, history of essential hypertension: Resolved. Initial blood pressure noted to be as low as 86/57, but improved after initial fluids. - Restart clonidine in a.m.   - Continue diltiazem  Hypokalemia: Acute.  Initial potassium noted to be 2.8 on admission. - Give 40 mEq of potassium chloride x1 dose  - Recheck potassium in a.m. and replace as needed  ESRD on HD: Patient currently on home hemodialysis 5 days a week skipping Wednesdays and  Saturdays.  Patient dry weight reported anywhere from 105.5 kg 109.5 kg.  Discussed case with nephrology who recommended recontact in a.m. if dialysis needed. - Daily weights - Continue home meds  History of pulmonary embolus: Patient reports having pulmonary emboli in 2001 in 2008 for which she is on Eliquis, but did not take a.m. dose this morning. - Continue Eliquis   Anxiety - Continue Wellbutrin and amitriptyline  Peripheral vascular disease: Patient currently being worked up by vascular surgery for issues with pain in right second and third toe related to poor blood flow. - Continue outpatient follow-up with vascular - Continue Dilaudid as needed  Diabetes mellitus type 2: Patient not on any insulin or oral medications for treatment. Hemoglobin A1c on care everywhere noted to be 5.3 on 07/13/2017.   - Renal/carb modified diet - Continue gabapentin   GERD - Continue Protonix  DVT prophylaxis: Eliquis Code Status: full Family Communication: Discussed plan of care with the patient and family present at bedside Disposition Plan: Likely discharge home in a.m. Consults called: Nephrology Admission status: Observation  Clydie Braun MD [facility] Pager [phone]   If 7PM-7AM, please contact night-coverage [email or web address] Password [email or web address]  07/15/2017, 12:10 PM

## 2017-07-15 NOTE — Progress Notes (Addendum)
Patient trasfered from ED to [phone] via wheelchair; alert and oriented x 4; complaints of pain in 2nd right toe; IV saline locked in RAC and R Hand; skin intact. Orient patient to room and unit; gave patient care guide; instructed how to use the call bell and  fall risk precautions. Will continue to monitor the patient.

## 2017-07-15 NOTE — ED Notes (Signed)
Attempted to call charge RN at [facility]; [facility], no one is answering; will try again.

## 2017-07-15 NOTE — ED Triage Notes (Signed)
Pt arrives via EMS. She was administering her home dialysis when the needle came out. Pt had a hard time stopping the bleeding. Bleeding is controlled at this time. Pt c/o dizziness and nausea. Pt is hypotensive per EMS. Pt received normal saline PTA.

## 2017-07-15 NOTE — ED Notes (Signed)
Blood consent obtained

## 2017-07-16 DIAGNOSIS — D62 Acute posthemorrhagic anemia: Secondary | ICD-10-CM | POA: Diagnosis not present

## 2017-07-16 DIAGNOSIS — N186 End stage renal disease: Secondary | ICD-10-CM | POA: Diagnosis not present

## 2017-07-16 DIAGNOSIS — Z992 Dependence on renal dialysis: Secondary | ICD-10-CM | POA: Diagnosis not present

## 2017-07-16 LAB — CBC
HCT: 25.1 % — ABNORMAL LOW (ref 36.0–46.0)
HEMOGLOBIN: 7.7 g/dL — AB (ref 12.0–15.0)
MCH: 29.3 pg (ref 26.0–34.0)
MCHC: 30.7 g/dL (ref 30.0–36.0)
MCV: 95.4 fL (ref 78.0–100.0)
Platelets: 114 10*3/uL — ABNORMAL LOW (ref 150–400)
RBC: 2.63 MIL/uL — ABNORMAL LOW (ref 3.87–5.11)
RDW: 15.3 % (ref 11.5–15.5)
WBC: 9.1 10*3/uL (ref 4.0–10.5)

## 2017-07-16 LAB — BPAM RBC
BLOOD PRODUCT EXPIRATION DATE: 201908112359
Blood Product Expiration Date: 201908112359
ISSUE DATE / TIME: 201907141438
ISSUE DATE / TIME: 201907141722
UNIT TYPE AND RH: 5100
Unit Type and Rh: 5100

## 2017-07-16 LAB — TYPE AND SCREEN
ABO/RH(D): O POS
Antibody Screen: NEGATIVE
UNIT DIVISION: 0
Unit division: 0

## 2017-07-16 LAB — RENAL FUNCTION PANEL
Albumin: 2.4 g/dL — ABNORMAL LOW (ref 3.5–5.0)
Anion gap: 10 (ref 5–15)
BUN: 46 mg/dL — ABNORMAL HIGH (ref 6–20)
CALCIUM: 8.8 mg/dL — AB (ref 8.9–10.3)
CO2: 30 mmol/L (ref 22–32)
CREATININE: 6.91 mg/dL — AB (ref 0.44–1.00)
Chloride: 98 mmol/L (ref 98–111)
GFR calc Af Amer: 7 mL/min — ABNORMAL LOW (ref >60)
GFR calc non Af Amer: 6 mL/min — ABNORMAL LOW (ref >60)
GLUCOSE: 106 mg/dL — AB (ref 70–99)
Phosphorus: 6.5 mg/dL — ABNORMAL HIGH (ref 2.5–4.6)
Potassium: 4.4 mmol/L (ref 3.5–5.1)
SODIUM: 138 mmol/L (ref 135–145)

## 2017-07-16 LAB — MAGNESIUM: MAGNESIUM: 2.2 mg/dL (ref 1.7–2.4)

## 2017-07-16 NOTE — Discharge Summary (Signed)
Physician Discharge Summary  Katie DolinLoretta Sharp [id number] DOB: [birth year] DOA: 07/15/2017  PCP: Ival BibleYoung, Kelly M, PA-C  Admit date: 07/15/2017 Discharge date: 07/16/2017  Admitted From: home Discharge disposition: home   Recommendations for Outpatient Follow-Up:   Resume home HD CBC 1 week  Discharge Diagnosis:   Principal Problem:   Acute blood loss anemia Active Problems:   End-stage renal disease on hemodialysis (HCC)   Anxiety   Hypotension   History of pulmonary embolism    Discharge Condition: Improved.  Diet recommendation: renal diet  Wound care: None.  Code status: Full.   History of Present Illness:   Katie Sharp is a 52 y.o. female with medical history significant of HTN, DM type 2, ESRD on home HD awaiting transplant from [facility], PE on Eliquis, PVD, and anxiety; who presents with complaints of bleeding.  Patient normally performs home hemodialysis 5 days a week skipping Wednesday and Saturday. She was approximately 2 hours into her session this morning when she felt blood running down her arm and noticed that the arterial dialysis needle came out.  She was able to return all of her blood thereafter, but was unable to stop bleeding for approximately 10 to 15 minutes.  Patient reported feeling lightheadedness, anxiety, and nausea.  Other associated symptoms include second and third right toe pain that has been ongoing over the last 3 weeks.  She is being worked up by vascular surgery because of decreased blood flow in the right leg.  Denies any chest pain, lightheadedness, vomiting, or shortness of breath. She had given herself 3200 units of heparin prior to dialysis, but not had not taken her morning Eliquis.  Patient reports a dry weight of 109.5 kg, but last office notes report a dry weight of his low as 105.5 kg. Patient's blood work from last week report hemoglobin at 7.3, and baseline runs around 8. Due to her inability to stop the bleeding she called  EMS. En route with EMS she was given 200 mL of normal saline because she was noted to be hypotensive     Hospital Course by Problem:   Acute blood loss anemia:  -improved with 2 units PRBC -recheck outpt  Hypotension, history of essential hypertension: Resolved. Initial blood pressure noted to be as low as 86/57, but improved after initial fluids. - Continue diltiazem  Hypokalemia: Acute.   -replaced  ESRD on HD: Patient currently on home hemodialysis 5 days a week skipping Wednesdays and Saturdays.  Patient dry weight reported anywhere from 105.5 kg 109.5 kg.    History of pulmonary embolus: Patient reports having pulmonary emboli in 2001 in 2008 for which she is on Eliquis - Continue Eliquis   Anxiety - Continue Wellbutrin and amitriptyline  Peripheral vascular disease: Patient currently being worked up by vascular surgery for issues with pain in right second and third toe related to poor blood flow. - Continue outpatient follow-up with vascular   Diabetes mellitus type 2: Patient not on any insulin or oral medications for treatment. Hemoglobin A1c on care everywhere noted to be 5.3 on 07/13/2017.   - Renal/carb modified diet - Continue gabapentin   GERD - Continue Protonix      Medical Consultants:      Discharge Exam:   Vitals:   07/16/17 0357 07/16/17 0825  BP: (!) 99/59 125/73  Pulse: 84   Resp: 18   Temp: 98 F (36.7 C)   SpO2: 99%    Vitals:   07/15/17 2020 07/15/17  2134 07/16/17 0357 07/16/17 0825  BP: (!) 105/48 (!) 105/54 (!) 99/59 125/73  Pulse: 95 94 84   Resp:  18 18   Temp: 99.1 F (37.3 C) 98.6 F (37 C) 98 F (36.7 C)   TempSrc: Oral Oral Oral   SpO2: 96% 99% 99%   Weight:   109.9 kg (242 lb 4.6 oz)   Height:        General exam: Appears calm and comfortable.  No dizziness/no SOB  The results of significant diagnostics from this hospitalization (including imaging, microbiology, ancillary and laboratory) are listed below  for reference.     Procedures and Diagnostic Studies:   No results found.   Labs:   Basic Metabolic Panel: Recent Labs  Lab 07/15/17 0939 07/16/17 0701  NA 135 138  K 2.8* 4.4  CL 95* 98  CO2 28 30  GLUCOSE 183* 106*  BUN 31* 46*  CREATININE 4.81* 6.91*  CALCIUM 8.5* 8.8*  MG  --  2.2  PHOS  --  6.5*   GFR Estimated Creatinine Clearance: 12 mL/min (A) (by C-G formula based on SCr of 6.91 mg/dL (H)). Liver Function Tests: Recent Labs  Lab 07/16/17 0701  ALBUMIN 2.4*   No results for input(s): LIPASE, AMYLASE in the last 168 hours. No results for input(s): AMMONIA in the last 168 hours. Coagulation profile No results for input(s): INR, PROTIME in the last 168 hours.  CBC: Recent Labs  Lab 07/15/17 0939 07/16/17 0701  WBC 7.2 9.1  NEUTROABS 5.5  --   HGB 5.7* 7.7*  HCT 18.6* 25.1*  MCV 97.4 95.4  PLT 118* PENDING   Cardiac Enzymes: No results for input(s): CKTOTAL, CKMB, CKMBINDEX, TROPONINI in the last 168 hours. BNP: Invalid input(s): POCBNP CBG: No results for input(s): GLUCAP in the last 168 hours. D-Dimer No results for input(s): DDIMER in the last 72 hours. Hgb A1c No results for input(s): HGBA1C in the last 72 hours. Lipid Profile No results for input(s): CHOL, HDL, LDLCALC, TRIG, CHOLHDL, LDLDIRECT in the last 72 hours. Thyroid function studies No results for input(s): TSH, T4TOTAL, T3FREE, THYROIDAB in the last 72 hours.  Invalid input(s): FREET3 Anemia work up No results for input(s): VITAMINB12, FOLATE, FERRITIN, TIBC, IRON, RETICCTPCT in the last 72 hours. Microbiology Recent Results (from the past 240 hour(s))  MRSA PCR Screening     Status: None   Collection Time: 07/15/17  3:48 PM  Result Value Ref Range Status   MRSA by PCR NEGATIVE NEGATIVE Final    Comment:        The GeneXpert MRSA Assay (FDA approved for NASAL specimens only), is one component of a comprehensive MRSA colonization surveillance program. It is not intended  to diagnose MRSA infection nor to guide or monitor treatment for MRSA infections. Performed at [facility], [address] [place], [place] [address]      Discharge Instructions:   Discharge Instructions    Discharge instructions   Complete by:  As directed    Renal diet Resume home HD CBC 1 week with PCP   Increase activity slowly   Complete by:  As directed      Allergies as of 07/16/2017      Reactions   Lisinopril Cough   Statins Other (See Comments)   Muscle cramps   Oxycodone Itching, Rash      Medication List    STOP taking these medications   cloNIDine 0.1 MG tablet Commonly known as:  CATAPRES  TAKE these medications   acetaminophen 500 MG tablet Commonly known as:  TYLENOL Take 1,000 mg by mouth as needed for mild pain or headache.   amitriptyline 25 MG tablet Commonly known as:  ELAVIL Take 25 mg by mouth at bedtime.   buPROPion 150 MG 24 hr tablet Commonly known as:  WELLBUTRIN XL Take 150 mg by mouth daily.   calcitRIOL 0.5 MCG capsule Commonly known as:  ROCALTROL Take 3 Capsules (1.51mcg) daily   Calcium Citrate-Vitamin D 315-250 MG-UNIT Tabs Take by mouth.   DIALYVITE/ZINC Tabs Take by mouth.   diltiazem 120 MG 24 hr capsule Commonly known as:  CARDIZEM CD Take 120 mg by mouth daily.   ELIQUIS 5 MG Tabs tablet Generic drug:  apixaban Take 5 mg by mouth 2 (two) times daily.   epoetin alfa 40981 UNIT/ML injection Commonly known as:  EPOGEN,PROCRIT Inject 20,000 Units into the vein 3 (three) times a week.   gabapentin 300 MG capsule Commonly known as:  NEURONTIN TK ONE C PO NIGHTLY AND 1 C AFTER DIALYSIS   HEPARIN LOCK FLUSH IV Inject 32,000 Units into the vein. Using 5 times weekly   HYDROmorphone 2 MG tablet Commonly known as:  DILAUDID Take 1 tablet (2 mg total) by mouth every 4 (four) hours as needed for severe pain.   lidocaine 4 % cream Commonly known as:  LMX Apply 1 application topically as needed (for  toe pain).   ondansetron 4 MG tablet Commonly known as:  ZOFRAN Take 4 mg by mouth every 8 (eight) hours as needed for nausea.   pantoprazole 40 MG tablet Commonly known as:  PROTONIX TK 1 T PO ONCE D   polyethylene glycol packet Commonly known as:  MIRALAX / GLYCOLAX Take 17 g by mouth daily.   promethazine 25 MG tablet Commonly known as:  PHENERGAN Take 1 tablet (25 mg total) by mouth every 6 (six) hours as needed for nausea or vomiting.   senna 8.6 MG Tabs tablet Commonly known as:  SENOKOT Take 1 tablet by mouth daily as needed for mild constipation.   VENOFER IV Inject 200 mg into the vein once a week.      Follow-up Information    Ival Bible, PA-C Follow up in 1 week(s).   Specialty:  Family Medicine Why:  with CBC Contact information: [address] [address] [phone]            Time coordinating discharge: 25 min  Signed:  Joseph Art  [facility] 07/16/2017, 8:37 AM

## 2017-07-16 NOTE — Progress Notes (Signed)
Patient discharged home with daughter. All belongings sent with patient. Discharge instructions given, all education provided and questions answered.

## 2017-07-31 ENCOUNTER — Emergency Department (HOSPITAL_BASED_OUTPATIENT_CLINIC_OR_DEPARTMENT_OTHER): Payer: Medicare Other

## 2017-07-31 ENCOUNTER — Emergency Department (HOSPITAL_BASED_OUTPATIENT_CLINIC_OR_DEPARTMENT_OTHER)
Admission: EM | Admit: 2017-07-31 | Discharge: 2017-07-31 | Disposition: A | Discharge status: Home / Self Care | Payer: Medicare Other | Attending: Emergency Medicine | Admitting: Emergency Medicine

## 2017-07-31 ENCOUNTER — Encounter (HOSPITAL_BASED_OUTPATIENT_CLINIC_OR_DEPARTMENT_OTHER): Payer: Self-pay | Admitting: *Deleted

## 2017-07-31 ENCOUNTER — Other Ambulatory Visit: Payer: Self-pay

## 2017-07-31 DIAGNOSIS — Z992 Dependence on renal dialysis: Secondary | ICD-10-CM | POA: Insufficient documentation

## 2017-07-31 DIAGNOSIS — I120 Hypertensive chronic kidney disease with stage 5 chronic kidney disease or end stage renal disease: Secondary | ICD-10-CM | POA: Insufficient documentation

## 2017-07-31 DIAGNOSIS — M79674 Pain in right toe(s): Secondary | ICD-10-CM | POA: Insufficient documentation

## 2017-07-31 DIAGNOSIS — Z7901 Long term (current) use of anticoagulants: Secondary | ICD-10-CM | POA: Insufficient documentation

## 2017-07-31 DIAGNOSIS — Z79899 Other long term (current) drug therapy: Secondary | ICD-10-CM | POA: Diagnosis not present

## 2017-07-31 DIAGNOSIS — I739 Peripheral vascular disease, unspecified: Secondary | ICD-10-CM | POA: Insufficient documentation

## 2017-07-31 DIAGNOSIS — E1122 Type 2 diabetes mellitus with diabetic chronic kidney disease: Secondary | ICD-10-CM | POA: Diagnosis not present

## 2017-07-31 DIAGNOSIS — N186 End stage renal disease: Secondary | ICD-10-CM | POA: Insufficient documentation

## 2017-07-31 DIAGNOSIS — E119 Type 2 diabetes mellitus without complications: Secondary | ICD-10-CM | POA: Insufficient documentation

## 2017-07-31 LAB — BASIC METABOLIC PANEL
ANION GAP: 18 — AB (ref 5–15)
BUN: 76 mg/dL — ABNORMAL HIGH (ref 6–20)
CO2: 21 mmol/L — AB (ref 22–32)
Calcium: 8.6 mg/dL — ABNORMAL LOW (ref 8.9–10.3)
Chloride: 99 mmol/L (ref 98–111)
Creatinine, Ser: 9.72 mg/dL — ABNORMAL HIGH (ref 0.44–1.00)
GFR calc Af Amer: 5 mL/min — ABNORMAL LOW (ref >60)
GFR calc non Af Amer: 4 mL/min — ABNORMAL LOW (ref >60)
GLUCOSE: 114 mg/dL — AB (ref 70–99)
Potassium: 4.4 mmol/L (ref 3.5–5.1)
Sodium: 138 mmol/L (ref 135–145)

## 2017-07-31 LAB — CBC WITH DIFFERENTIAL/PLATELET
Basophils Absolute: 0 10*3/uL (ref 0.0–0.1)
Basophils Relative: 1 %
EOS PCT: 4 %
Eosinophils Absolute: 0.3 10*3/uL (ref 0.0–0.7)
HCT: 29.1 % — ABNORMAL LOW (ref 36.0–46.0)
Hemoglobin: 9.1 g/dL — ABNORMAL LOW (ref 12.0–15.0)
LYMPHS ABS: 1.1 10*3/uL (ref 0.7–4.0)
LYMPHS PCT: 13 %
MCH: 30.7 pg (ref 26.0–34.0)
MCHC: 31.3 g/dL (ref 30.0–36.0)
MCV: 98.3 fL (ref 78.0–100.0)
Monocytes Absolute: 0.3 10*3/uL (ref 0.1–1.0)
Monocytes Relative: 4 %
Neutro Abs: 6.6 10*3/uL (ref 1.7–7.7)
Neutrophils Relative %: 78 %
PLATELETS: 124 10*3/uL — AB (ref 150–400)
RBC: 2.96 MIL/uL — AB (ref 3.87–5.11)
RDW: 16.6 % — ABNORMAL HIGH (ref 11.5–15.5)
WBC: 8.4 10*3/uL (ref 4.0–10.5)

## 2017-07-31 MED ORDER — CEPHALEXIN 250 MG PO CAPS
500.0000 mg | ORAL_CAPSULE | Freq: Once | ORAL | Status: AC
  Administered 2017-07-31: 500 mg via ORAL
  Filled 2017-07-31: qty 2

## 2017-07-31 MED ORDER — CEPHALEXIN 500 MG PO CAPS: 500.0000 mg | ORAL_CAPSULE | Freq: Four times a day (QID) | ORAL | 0 refills | Status: AC

## 2017-07-31 MED ORDER — HYDROCODONE-ACETAMINOPHEN 5-325 MG PO TABS: 1.0000 | ORAL_TABLET | ORAL | 0 refills | Status: AC | PRN

## 2017-07-31 MED ORDER — HYDROCODONE-ACETAMINOPHEN 5-325 MG PO TABS
1.0000 | ORAL_TABLET | Freq: Once | ORAL | Status: AC
  Administered 2017-07-31: 1 via ORAL
  Filled 2017-07-31: qty 1

## 2017-07-31 NOTE — ED Triage Notes (Signed)
She has a hx of poor circulation. She is here for pain in her right 2nd toe. It started bleeding a week ago while she was on a cruise.

## 2017-07-31 NOTE — Discharge Instructions (Signed)
Please continue antibiotics and pain medicine until you can follow up with vascular surgery Return if worsening

## 2017-07-31 NOTE — ED Notes (Signed)
Pt requesting pain medication.  

## 2017-07-31 NOTE — ED Provider Notes (Signed)
[facility] EMERGENCY DEPARTMENT Provider Note   CSN: [id number] Arrival date & time: 07/31/17  1950     History   Chief Complaint Chief Complaint  Patient presents with  . Foot Pain    HPI Katie Sharp is a 52 y.o. female who presents with right 2nd toe pain. PMH significant for ESRD on dialysis (does dialysis 5 days a week at home), hx of DVT/PE on Eliquis, hx of recent splenic infarct, PAD. She sees Dr. Lynnea Ferrier with vascular at [facility] and is scheduled for an IR abdominal bi-femoral angiogram next Tuesday due to pre-gangrenous changes to her toe. She states that she feels like she just cannot wait until then because she's in so much pain. She denies fevers. She has had bleeding from the toe but it's been controllable. No odor or discharge from the toe but the skin has been peeling off which concerned her.  HPI  Past Medical History:  Diagnosis Date  . Bowel obstruction (HCC)   . Diabetes mellitus without complication (HCC)   . Dialysis patient (HCC)   . DVT (deep venous thrombosis) (HCC)   . Hypertension   . Pulmonary emboli (HCC)   . Renal disorder     Patient Active Problem List   Diagnosis Date Noted  . Acute blood loss anemia 07/15/2017  . Symptomatic anemia 07/15/2017  . End-stage renal disease on hemodialysis (HCC) 07/15/2017  . Anxiety 07/15/2017  . Hypotension 07/15/2017  . History of pulmonary embolism 07/15/2017    Past Surgical History:  Procedure Laterality Date  . COLON RESECTION    . GASTROPLASTY DUODENAL SWITCH       OB History   None      Home Medications    Prior to Admission medications   Medication Sig Start Date End Date Taking? Authorizing Provider  acetaminophen (TYLENOL) 500 MG tablet Take 1,000 mg by mouth as needed for mild pain or headache.    [provider]  amitriptyline (ELAVIL) 25 MG tablet Take 25 mg by mouth at bedtime.  01/30/17 01/30/18  [provider]  apixaban (ELIQUIS) 5 MG TABS tablet Take  5 mg by mouth 2 (two) times daily.  10/03/16   [provider]  B Complex-C-Zn-Folic Acid (DIALYVITE/ZINC) TABS Take by mouth. 12/08/15   [provider]  buPROPion (WELLBUTRIN XL) 150 MG 24 hr tablet Take 150 mg by mouth daily.  01/30/17 01/30/18  [provider]  calcitRIOL (ROCALTROL) 0.5 MCG capsule Take 3 Capsules (1.62mcg) daily 06/11/17   [provider]  Calcium Citrate-Vitamin D 315-250 MG-UNIT TABS Take by mouth. 09/27/16 09/27/17  [provider]  diltiazem (CARDIZEM CD) 120 MG 24 hr capsule Take 120 mg by mouth daily.  05/16/17   [provider]  epoetin alfa (EPOGEN,PROCRIT) 40981 UNIT/ML injection Inject 20,000 Units into the vein 3 (three) times a week.    [provider]  gabapentin (NEURONTIN) 300 MG capsule TK ONE C PO NIGHTLY AND 1 C AFTER DIALYSIS 06/20/17   [provider]  HEPARIN LOCK FLUSH IV Inject 32,000 Units into the vein. Using 5 times weekly    [provider]  HYDROmorphone (DILAUDID) 2 MG tablet Take 1 tablet (2 mg total) by mouth every 4 (four) hours as needed for severe pain. 06/27/17   Melene Plan, DO  Iron Sucrose (VENOFER IV) Inject 200 mg into the vein once a week.    [provider]  lidocaine (LMX) 4 % cream Apply 1 application topically as needed (  for toe pain).    [provider]  ondansetron (ZOFRAN) 4 MG tablet Take 4 mg by mouth every 8 (eight) hours as needed for nausea.     [provider]  pantoprazole (PROTONIX) 40 MG tablet TK 1 T PO ONCE D 05/16/17   [provider]  polyethylene glycol (MIRALAX / GLYCOLAX) packet Take 17 g by mouth daily.    [provider]  promethazine (PHENERGAN) 25 MG tablet Take 1 tablet (25 mg total) by mouth every 6 (six) hours as needed for nausea or vomiting. 06/27/17   Melene Plan, DO  senna (SENOKOT) 8.6 MG TABS tablet Take 1 tablet by mouth daily as needed for mild constipation.  05/08/17   [provider]    Family History No family history on file.  Social History Social History   Tobacco Use  . Smoking status: Never Smoker  . Smokeless tobacco: Never Used  Substance Use Topics  . Alcohol use: Yes    Comment: occ  . Drug use: Never     Allergies   Lisinopril; Statins; and Oxycodone   Review of Systems Review of Systems  Constitutional: Negative for fever.  Musculoskeletal: Positive for arthralgias.  Skin: Positive for color change.     Physical Exam Updated Vital Signs BP 132/76   Pulse 81   Temp 98.1 F (36.7 C) (Oral)   Resp 18   Ht 5\' 5"  (1.651 m)   Wt 109.8 kg (242 lb)   SpO2 100%   BMI 40.27 kg/m   Physical Exam  Constitutional: She is oriented to person, place, and time. She appears well-developed and well-nourished. No distress.  HENT:  Head: Normocephalic and atraumatic.  Eyes: Pupils are equal, round, and reactive to light. Conjunctivae are normal. Right eye exhibits no discharge. Left eye exhibits no discharge. No scleral icterus.  Neck: Normal range of motion.  Cardiovascular: Normal rate.  Pulmonary/Chest: Effort normal. No respiratory distress.  Abdominal: She exhibits no distension.  Musculoskeletal:  Right foot: 2nd-4th does appear dusky and 2nd toe is exquisitely tender to palpation with superficial skin ulceration. DP pulse was not palpable but faint pulse was obtained by doppler  Neurological: She is alert and oriented to person, place, and time.  Skin: Skin is warm and dry.  Psychiatric: She has a normal mood and affect. Her behavior is normal.  Nursing note and vitals reviewed.        ED Treatments / Results  Labs (all labs ordered are listed, but only abnormal results are displayed) Labs Reviewed  BASIC METABOLIC PANEL - Abnormal; Notable for the following components:      Result Value   CO2 21 (*)    Glucose, Bld 114 (*)    BUN 76 (*)    Creatinine, Ser 9.72 (*)    Calcium 8.6 (*)    GFR calc non Af Amer 4 (*)     GFR calc Af Amer 5 (*)    Anion gap 18 (*)    All other components within normal limits  CBC WITH DIFFERENTIAL/PLATELET - Abnormal; Notable for the following components:   RBC 2.96 (*)    Hemoglobin 9.1 (*)    HCT 29.1 (*)    RDW 16.6 (*)    Platelets 124 (*)    All other components within normal limits    EKG None  Radiology Dg Foot Complete Right  Result Date: 07/31/2017 CLINICAL DATA:  Poor circulation with foot pain EXAM: RIGHT FOOT COMPLETE - 3+  VIEW COMPARISON:  None. FINDINGS: No subluxation. Diffuse vascular calcifications. Moderate plantar calcaneal spur. Lucency through the head of the third middle phalanx. No radiopaque foreign body. IMPRESSION: Lucency through the head of the third middle phalanx is suspect for a fracture. Electronically Signed   By: Jasmine PangKim  Fujinaga M.D.   On: 07/31/2017 21:12    Procedures Procedures (including critical care time)  Medications Ordered in ED Medications  HYDROcodone-acetaminophen (NORCO/VICODIN) 5-325 MG per tablet 1 tablet (1 tablet Oral Given 07/31/17 2056)  cephALEXin (KEFLEX) capsule 500 mg (500 mg Oral Given 07/31/17 2056)     Initial Impression / Assessment and Plan / ED Course  I have reviewed the triage vital signs and the nursing notes.  Pertinent labs & imaging results that were available during my care of the patient were reviewed by me and considered in my medical decision making (see chart for details).  52 year old female presents with worsening pain and skin changes to the 2nd great toe with pain extending to her other toes as well. She did hit the 3rd toe on something several days ago. Her vitals are normal. A pulse was dopplerable. CBC is remarkable for improved anemia. BMP is remarkable for slightly worse SCr - she did not do dialysis today but will do tomorrow. Xray shows possible distal 3rd toe fracture. She was given pain medicine and antibiotics for possible infection. She is agreeable to f/u with vascular surgery and  was given strict return precautions.  Final Clinical Impressions(s) / ED Diagnoses   Final diagnoses:  PAD (peripheral artery disease) (HCC)  Toe pain, right    ED Discharge Orders    None       Beryle QuantGekas, Artur Winningham Marie, PA-C 07/31/17 2320    Mesner, Barbara CowerJason, MD 07/31/17 [phone]

## 2018-02-02 DEATH — deceased

## 2019-03-18 IMAGING — CT CT ABD-PELV W/ CM
2 of 5 series · 16 of 46 positions shown, 18 images · IV contrast (APPLIED)
Comparison: None.

CLINICAL DATA: 51 y/o F; 2 days of left-sided abdominal pain. ESRD
and constipation.

EXAM:
CT ABDOMEN AND PELVIS WITH CONTRAST
TECHNIQUE: Multidetector CT imaging of the abdomen and pelvis was performed
using the standard protocol following bolus administration of
intravenous contrast.
CONTRAST:  100mL [id number] IOPAMIDOL ([id number]) INJECTION 61%

[Series 5: coronal st · coronal · 0.79mm/px · 3 of 92 slices shown]
[im 31/92  soft-tissue]
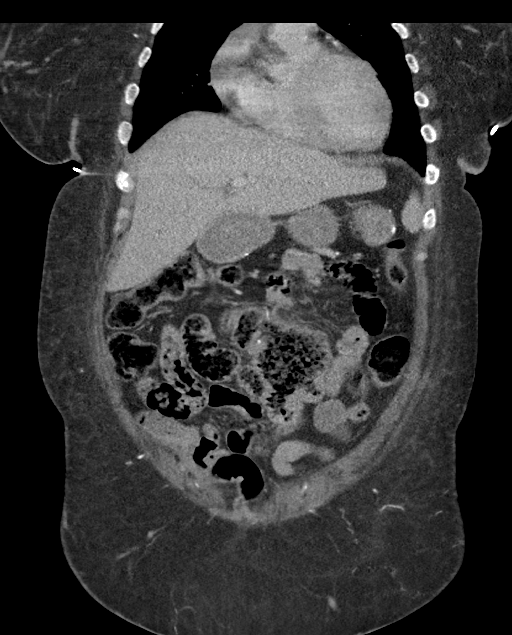
[im 41/92  soft-tissue]
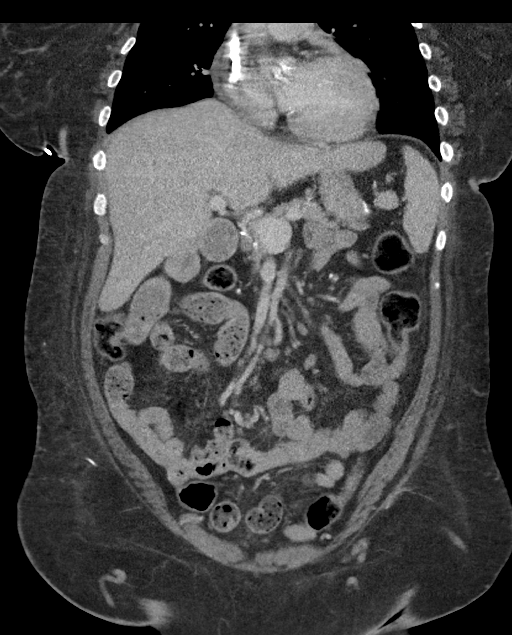
[im 51/92  soft-tissue]
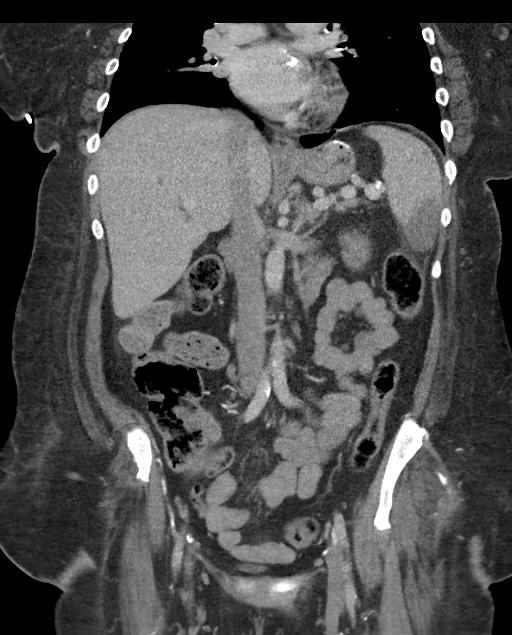

[Series 8: axial st2 · axial · 0.85mm/px · z∈[-446,-6]mm · 13 of 100 slices shown, 15 images]
[im 6/100  soft-tissue]
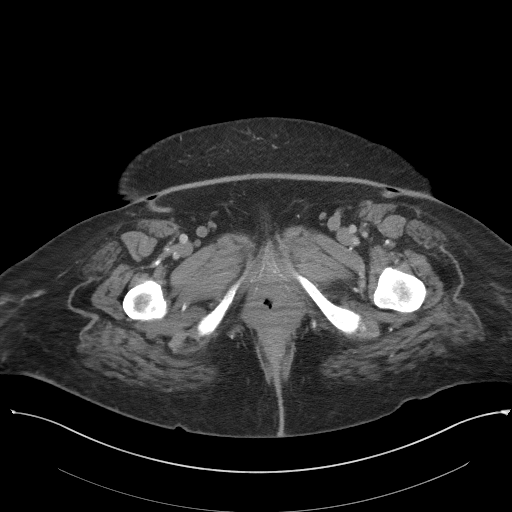
[im 6/100  bone]
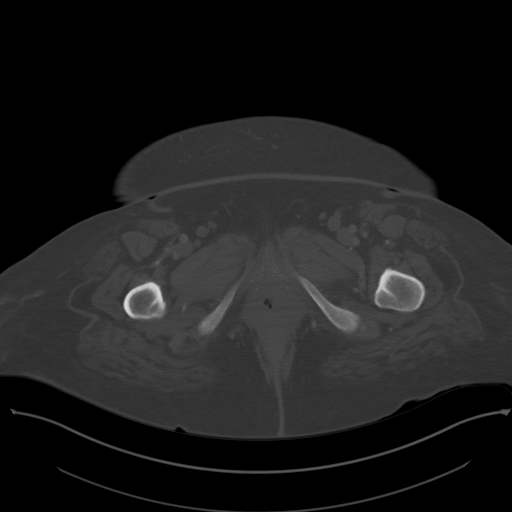
[im 16/100  soft-tissue]
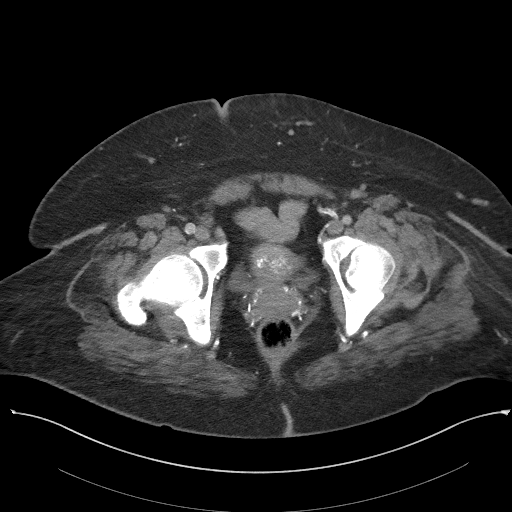
[im 21/100  soft-tissue]
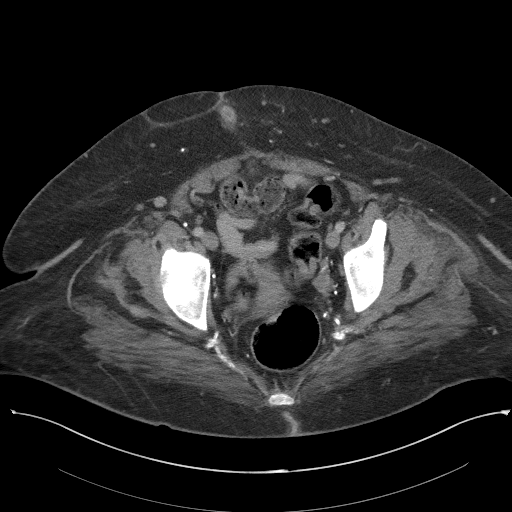
[im 27/100  soft-tissue]
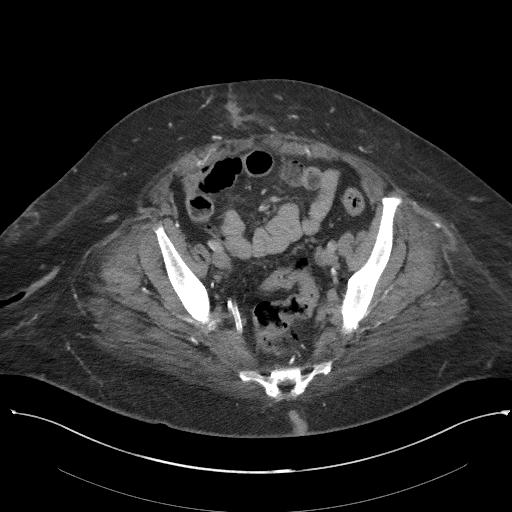
[im 37/100  soft-tissue]
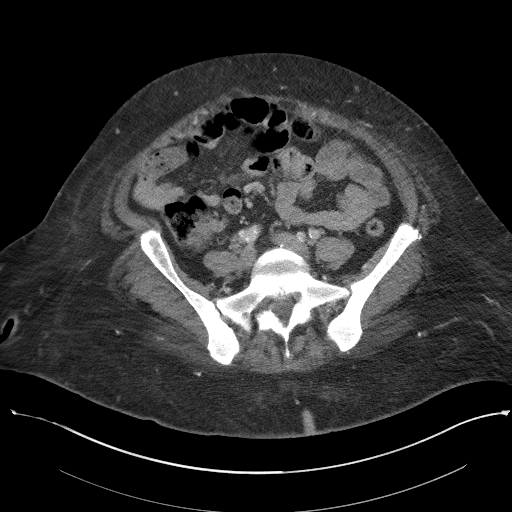
[im 42/100  soft-tissue]
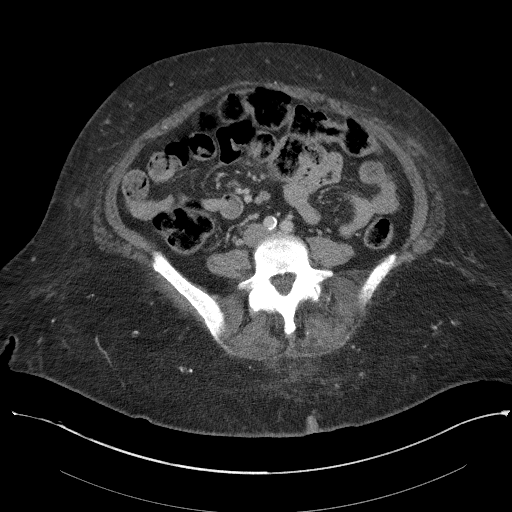
[im 53/100  soft-tissue]
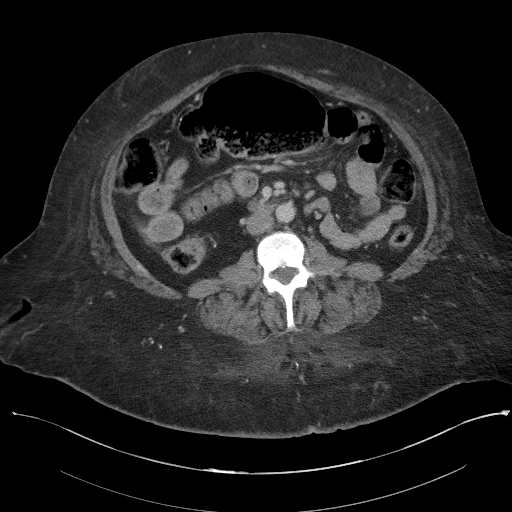
[im 58/100  soft-tissue]
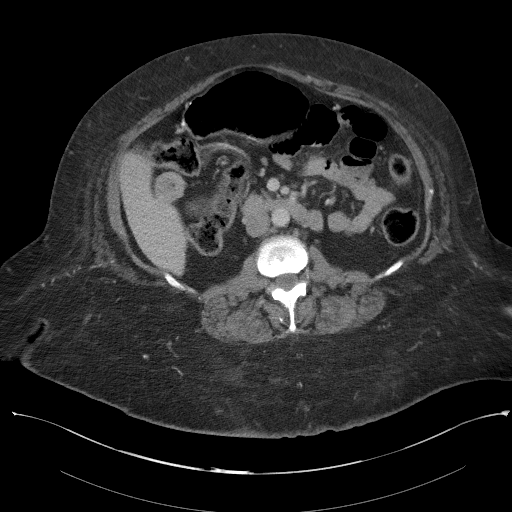
[im 63/100  soft-tissue]
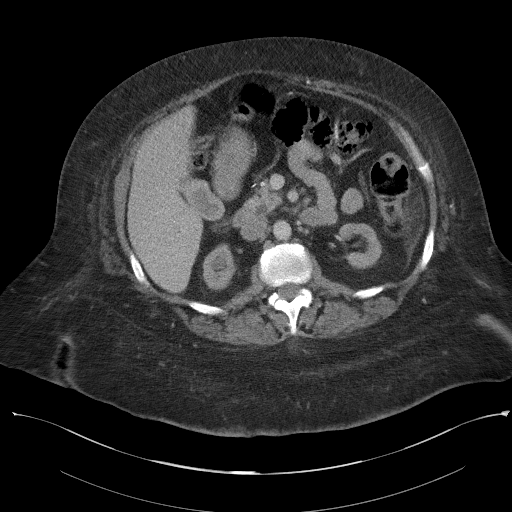
[im 63/100  bone]
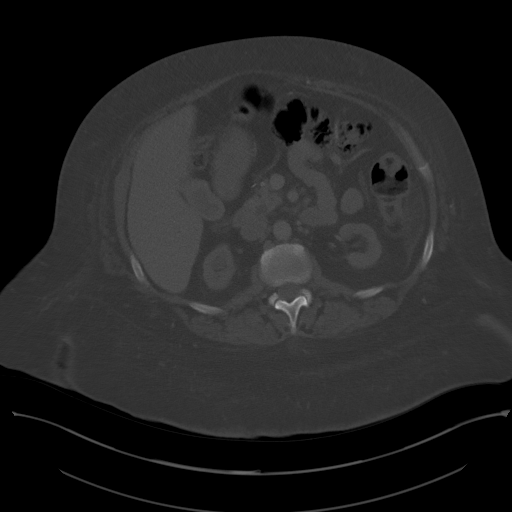
[im 73/100  soft-tissue]
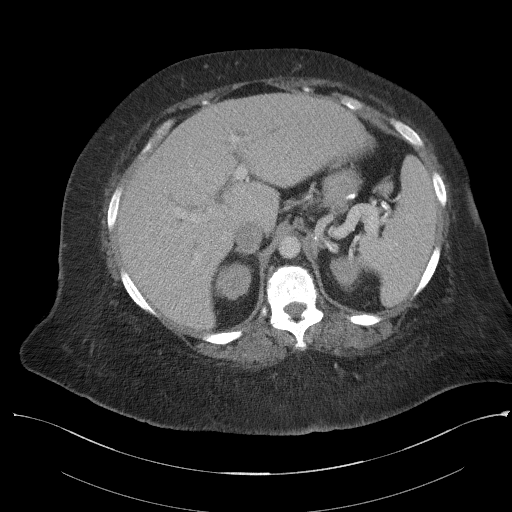
[im 79/100  soft-tissue]
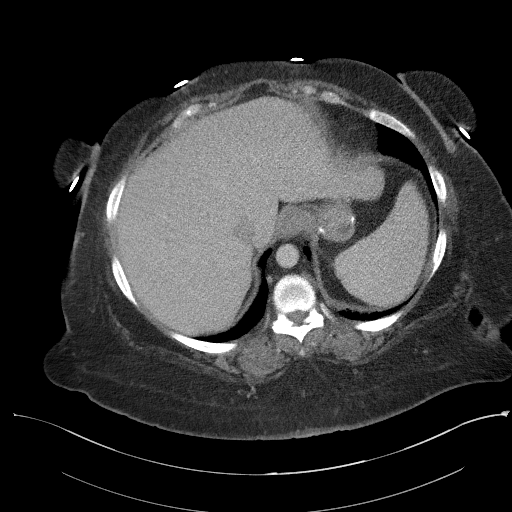
[im 84/100  soft-tissue]
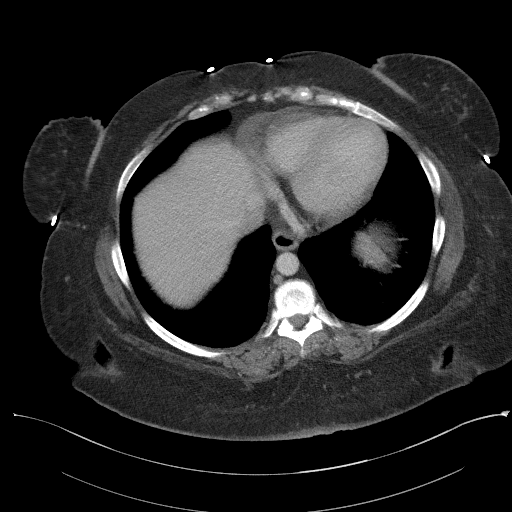
[im 94/100  soft-tissue]
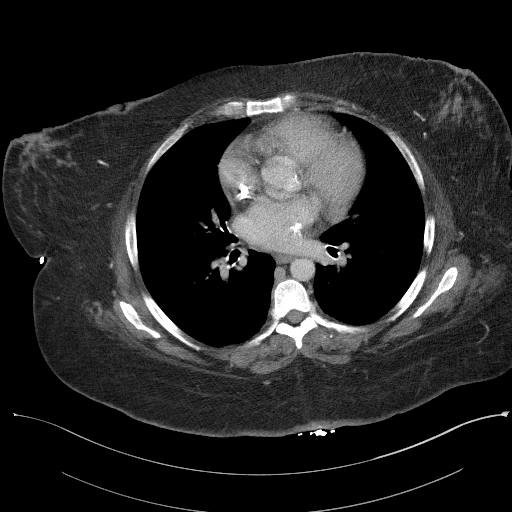

[16 of 46 positions shown; findings below may reference images not displayed]

FINDINGS: Lower chest: Aortic valvular and mitral annular calcification.

Hepatobiliary: No focal liver abnormality is seen. Status post
cholecystectomy. No biliary dilatation.

Pancreas: Unremarkable. No pancreatic ductal dilatation or
surrounding inflammatory changes.

Spleen: Wedge-shaped low-attenuation focus within the inferior
spleen (series 6, image 98). Mild surrounding edema.

Adrenals/Urinary Tract: Adrenal glands are unremarkable. Kidneys are
normal, without renal calculi, focal lesion, or hydronephrosis.
Bladder is unremarkable.

Stomach/Bowel: Gastroplasty postsurgical changes with patent
anastomosis. Appendix appears normal. No evidence of bowel wall
thickening, distention, or inflammatory changes.

Vascular/Lymphatic: Aortic atherosclerosis. No enlarged abdominal or
pelvic lymph nodes.

Reproductive: Uterus and bilateral adnexa are unremarkable.

Other: No abdominal wall hernia or abnormality. No abdominopelvic
ascites.

Musculoskeletal: No acute or significant osseous findings.
IMPRESSION: Wedge-shaped hypoenhancement within the lower spleen with mild
surrounding edema, likely splenic infarct.

By: Jens-Henrik Meichsner M.D.
